# Patient Record
Sex: Male | Born: 1985 | Race: White | Hispanic: No | Marital: Married | Smoking: Never smoker
Health system: Southern US, Community
[De-identification: ages and names within clinical notes are randomized; demographics above are authoritative.]

## PROBLEM LIST (undated history)

## (undated) DIAGNOSIS — S060XAA Concussion with loss of consciousness status unknown, initial encounter: Secondary | ICD-10-CM

## (undated) DIAGNOSIS — S060X9A Concussion with loss of consciousness of unspecified duration, initial encounter: Secondary | ICD-10-CM

---

## 2014-10-26 ENCOUNTER — Emergency Department (HOSPITAL_COMMUNITY)
Admission: EM | Admit: 2014-10-26 | Discharge: 2014-10-27 | Disposition: A | Discharge status: Home / Self Care | Payer: PRIVATE HEALTH INSURANCE | Attending: Emergency Medicine | Admitting: Emergency Medicine

## 2014-10-26 ENCOUNTER — Emergency Department (HOSPITAL_COMMUNITY)
Admission: EM | Admit: 2014-10-26 | Discharge: 2014-10-26 | Disposition: A | Discharge status: Home / Self Care | Payer: PRIVATE HEALTH INSURANCE | Source: Home / Self Care | Attending: Emergency Medicine | Admitting: Emergency Medicine

## 2014-10-26 ENCOUNTER — Encounter (HOSPITAL_COMMUNITY): Payer: Self-pay

## 2014-10-26 DIAGNOSIS — M542 Cervicalgia: Secondary | ICD-10-CM

## 2014-10-26 DIAGNOSIS — Y9289 Other specified places as the place of occurrence of the external cause: Secondary | ICD-10-CM | POA: Insufficient documentation

## 2014-10-26 DIAGNOSIS — Y9389 Activity, other specified: Secondary | ICD-10-CM | POA: Insufficient documentation

## 2014-10-26 DIAGNOSIS — G8911 Acute pain due to trauma: Secondary | ICD-10-CM

## 2014-10-26 DIAGNOSIS — S1081XA Abrasion of other specified part of neck, initial encounter: Secondary | ICD-10-CM | POA: Diagnosis not present

## 2014-10-26 DIAGNOSIS — Z8679 Personal history of other diseases of the circulatory system: Secondary | ICD-10-CM

## 2014-10-26 DIAGNOSIS — Y998 Other external cause status: Secondary | ICD-10-CM

## 2014-10-26 DIAGNOSIS — S060X0D Concussion without loss of consciousness, subsequent encounter: Secondary | ICD-10-CM | POA: Insufficient documentation

## 2014-10-26 DIAGNOSIS — Z79899 Other long term (current) drug therapy: Secondary | ICD-10-CM | POA: Insufficient documentation

## 2014-10-26 DIAGNOSIS — S199XXA Unspecified injury of neck, initial encounter: Secondary | ICD-10-CM | POA: Diagnosis present
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
## 2014-10-26 NOTE — ED Notes (Signed)
Bed: WLPT3 Expected date:  Expected time:  Means of arrival:  Comments: Hold 

## 2014-10-27 ENCOUNTER — Emergency Department (HOSPITAL_COMMUNITY): Payer: PRIVATE HEALTH INSURANCE

## 2014-10-27 ENCOUNTER — Encounter (HOSPITAL_COMMUNITY): Payer: Self-pay

## 2014-10-27 LAB — CBC WITH DIFFERENTIAL/PLATELET
Basophils Absolute: 0 10*3/uL (ref 0.0–0.1)
Basophils Relative: 0 % (ref 0–1)
Eosinophils Absolute: 0.1 10*3/uL (ref 0.0–0.7)
Eosinophils Relative: 0 % (ref 0–5)
HCT: 41.6 % (ref 39.0–52.0)
Hemoglobin: 14.2 g/dL (ref 13.0–17.0)
Lymphocytes Relative: 14 % (ref 12–46)
Lymphs Abs: 1.7 10*3/uL (ref 0.7–4.0)
MCH: 29.5 pg (ref 26.0–34.0)
MCHC: 34.1 g/dL (ref 30.0–36.0)
MCV: 86.3 fL (ref 78.0–100.0)
MONO ABS: 0.6 10*3/uL (ref 0.1–1.0)
Monocytes Relative: 5 % (ref 3–12)
Neutro Abs: 10 10*3/uL — ABNORMAL HIGH (ref 1.7–7.7)
Neutrophils Relative %: 81 % — ABNORMAL HIGH (ref 43–77)
Platelets: 322 10*3/uL (ref 150–400)
RBC: 4.82 MIL/uL (ref 4.22–5.81)
RDW: 12.4 % (ref 11.5–15.5)
WBC: 12.3 10*3/uL — AB (ref 4.0–10.5)

## 2014-10-27 LAB — I-STAT CHEM 8, ED
BUN: 18 mg/dL (ref 6–23)
Calcium, Ion: 1.2 mmol/L (ref 1.12–1.23)
Chloride: 103 mEq/L (ref 96–112)
Creatinine, Ser: 0.9 mg/dL (ref 0.50–1.35)
GLUCOSE: 108 mg/dL — AB (ref 70–99)
HCT: 43 % (ref 39.0–52.0)
Hemoglobin: 14.6 g/dL (ref 13.0–17.0)
Potassium: 3.8 mmol/L (ref 3.5–5.1)
SODIUM: 141 mmol/L (ref 135–145)
TCO2: 24 mmol/L (ref 0–100)

## 2014-10-27 LAB — CK: Total CK: 280 U/L — ABNORMAL HIGH (ref 7–232)

## 2014-10-27 MED ORDER — METOCLOPRAMIDE HCL 5 MG/ML IJ SOLN
10.0000 mg | Freq: Once | INTRAMUSCULAR | Status: AC
Start: 2014-10-27 — End: 2014-10-27
  Administered 2014-10-27: 10 mg via INTRAVENOUS
  Filled 2014-10-27: qty 2

## 2014-10-27 MED ORDER — SODIUM CHLORIDE 0.9 % IV BOLUS (SEPSIS)
1000.0000 mL | Freq: Once | INTRAVENOUS | Status: AC
  Administered 2014-10-27: 1000 mL via INTRAVENOUS

## 2014-10-27 MED ORDER — KETOROLAC TROMETHAMINE 30 MG/ML IJ SOLN
30.0000 mg | Freq: Once | INTRAMUSCULAR | Status: AC
  Administered 2014-10-27: 30 mg via INTRAVENOUS
  Filled 2014-10-27: qty 1

## 2014-10-27 MED ORDER — DIPHENHYDRAMINE HCL 50 MG/ML IJ SOLN
50.0000 mg | Freq: Once | INTRAMUSCULAR | Status: AC
  Administered 2014-10-27: 50 mg via INTRAVENOUS
  Filled 2014-10-27: qty 1

## 2014-10-27 MED ORDER — IOHEXOL 350 MG/ML SOLN
100.0000 mL | Freq: Once | INTRAVENOUS | Status: AC | PRN
  Administered 2014-10-27: 100 mL via INTRAVENOUS

## 2014-10-27 MED ORDER — METOCLOPRAMIDE HCL 10 MG PO TABS: 10.0000 mg | ORAL_TABLET | Freq: Three times a day (TID) | ORAL | Status: AC | PRN
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
## 2014-10-27 NOTE — ED Notes (Signed)
Patient transported to CT 

## 2014-10-27 NOTE — Discharge Instructions (Signed)
[name], your CT scans were normal. You have a concussion, continue to take Motrin and Reglan at home as prescribed for headache. You need to follow-up with a primary care physician within 3 days for continued management. Treatment for concussion is full cognitive rest, including no TV, no tablets, no texting, no reading. Your primary care physician will slowly incorporate these activities back into your regimen. For any worsening symptoms come back to emergency department immediately. Thank you. A concussion is a brain injury. It is caused by:  A hit to the head.  A quick and sudden movement (jolt) of the head or neck. A concussion is usually not life threatening. Even so, it can cause serious problems. If you had a concussion before, you may have concussion-like problems after a hit to your head. HOME CARE General Instructions  Follow your doctor's directions carefully.  Take medicines only as told by your doctor.  Only take medicines your doctor says are safe.  Do not drink alcohol until your doctor says it is okay. Alcohol and some drugs can slow down healing. They can also put you at risk for further injury.  If you are having trouble remembering things, write them down.  Try to do one thing at a time if you get distracted easily. For example, do not watch TV while making dinner.  Talk to your family members or close friends when making important decisions.  Follow up with your doctor as told.  Watch your symptoms. Tell others to do the same. Serious problems can sometimes happen after a concussion. Older adults are more likely to have these problems.  Tell your teachers, school nurse, school counselor, coach, Event organiser, or work Production designer, theatre/television/film about your concussion. Tell them about what you can or cannot do. They should watch to see if:  It gets even harder for you to pay attention or concentrate.  It gets even harder for you to remember things or learn new  things.  You need more time than normal to finish things.  You become annoyed (irritable) more than before.  You are not able to deal with stress as well.  You have more problems than before.  Rest. Make sure you:  Get plenty of sleep at night.  Go to sleep early.  Go to bed at the same time every day. Try to wake up at the same time.  Rest during the day.  Take naps when you feel tired.  Limit activities where you have to think a lot or concentrate. These include:  Doing homework.  Doing work related to a job.  Watching TV.  Using the computer. Returning To Your Regular Activities Return to your normal activities slowly, not all at once. You must give your body and brain enough time to heal.   Do not play sports or do other athletic activities until your doctor says it is okay.  Ask your doctor when you can drive, ride a bicycle, or work other vehicles or machines. Never do these things if you feel dizzy.  Ask your doctor about when you can return to work or school. Preventing Another Concussion It is very important to avoid another brain injury, especially before you have healed. In rare cases, another injury can lead to permanent brain damage, brain swelling, or death. The risk of this is greatest during the first 7-10 days after your injury. Avoid injuries by:   Wearing a seat belt when riding in a car.  Not drinking too much alcohol.  Avoiding activities that could lead to a second concussion (such as contact sports).  Wearing a helmet when doing activities like:  Biking.  Skiing.  Skateboarding.  Skating.  Making your home safer by:  Removing things from the floor or stairways that could make you trip.  Using grab bars in bathrooms and handrails by stairs.  Placing non-slip mats on floors and in bathtubs.  Improve lighting in dark areas. GET HELP IF:  It gets even harder for you to pay attention or concentrate.  It gets even harder for you  to remember things or learn new things.  You need more time than normal to finish things.  You become annoyed (irritable) more than before.  You are not able to deal with stress as well.  You have more problems than before.  You have problems keeping your balance.  You are not able to react quickly when you should. Get help if you have any of these problems for more than 2 weeks:   Lasting (chronic) headaches.  Dizziness or trouble balancing.  Feeling sick to your stomach (nausea).  Seeing (vision) problems.  Being affected by noises or light more than normal.  Feeling sad, low, down in the dumps, blue, gloomy, or empty (depressed).  Mood changes (mood swings).  Feeling of fear or nervousness about what may happen (anxiety).  Feeling annoyed.  Memory problems.  Problems concentrating or paying attention.  Sleep problems.  Feeling tired all the time. GET HELP RIGHT AWAY IF:   You have bad headaches or your headaches get worse.  You have weakness (even if it is in one hand, leg, or part of the face).  You have loss of feeling (numbness).  You feel off balance.  You keep throwing up (vomiting).  You feel tired.  One black center of your eye (pupil) is larger than the other.  You twitch or shake violently (convulse).  Your speech is not clear (slurred).  You are more confused, easily angered (agitated), or annoyed than before.  You have more trouble resting than before.  You are unable to recognize people or places.  You have neck pain.  It is difficult to wake you up.  You have unusual behavior changes.  You pass out (lose consciousness). MAKE SURE YOU:   Understand these instructions.  Will watch your condition.  Will get help right away if you are not doing well or get worse. Document Released: 09/15/2009 Document Revised: 02/11/2014 Document Reviewed: 04/19/2013 [facility] Patient Information 2015 [place], [place]. This information is not  intended to replace advice given to you by your health care provider. Make sure you discuss any questions you have with your health care provider.
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
## 2015-01-18 ENCOUNTER — Encounter (HOSPITAL_COMMUNITY): Payer: Self-pay | Admitting: Emergency Medicine

## 2015-01-18 ENCOUNTER — Emergency Department (HOSPITAL_COMMUNITY)
Admission: EM | Admit: 2015-01-18 | Discharge: 2015-01-18 | Disposition: A | Discharge status: Home / Self Care | Payer: PRIVATE HEALTH INSURANCE | Attending: Emergency Medicine | Admitting: Emergency Medicine

## 2015-01-18 DIAGNOSIS — Z79899 Other long term (current) drug therapy: Secondary | ICD-10-CM | POA: Diagnosis not present

## 2015-01-18 DIAGNOSIS — Y998 Other external cause status: Secondary | ICD-10-CM | POA: Insufficient documentation

## 2015-01-18 DIAGNOSIS — W503XXA Accidental bite by another person, initial encounter: Secondary | ICD-10-CM

## 2015-01-18 DIAGNOSIS — S51851A Open bite of right forearm, initial encounter: Secondary | ICD-10-CM | POA: Diagnosis not present

## 2015-01-18 DIAGNOSIS — Y9289 Other specified places as the place of occurrence of the external cause: Secondary | ICD-10-CM | POA: Insufficient documentation

## 2015-01-18 DIAGNOSIS — Y9389 Activity, other specified: Secondary | ICD-10-CM | POA: Diagnosis not present

## 2015-01-18 HISTORY — DX: Concussion with loss of consciousness of unspecified duration, initial encounter: S06.0X9A

## 2015-01-18 HISTORY — DX: Concussion with loss of consciousness status unknown, initial encounter: S06.0XAA

## 2015-01-18 MED ORDER — AMOXICILLIN-POT CLAVULANATE 875-125 MG PO TABS: 1.0000 | ORAL_TABLET | Freq: Two times a day (BID) | ORAL | Status: AC
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
